# Patient Record
Sex: Female | Born: 1955 | Race: White | Hispanic: No | Smoking: Current every day smoker
Health system: Southern US, Community
[De-identification: ages and names within clinical notes are randomized; demographics above are authoritative.]

---

## 2001-08-02 HISTORY — PX: OTHER SURGICAL HISTORY: SHX169

## 2002-08-31 ENCOUNTER — Ambulatory Visit (HOSPITAL_COMMUNITY): Admission: RE | Admit: 2002-08-31 | Discharge: 2002-08-31 | Payer: Self-pay | Admitting: Pulmonary Disease

## 2002-11-02 ENCOUNTER — Ambulatory Visit (HOSPITAL_COMMUNITY): Admission: RE | Admit: 2002-11-02 | Discharge: 2002-11-02 | Payer: Self-pay | Admitting: Family Medicine

## 2003-05-02 ENCOUNTER — Ambulatory Visit (HOSPITAL_COMMUNITY): Admission: RE | Admit: 2003-05-02 | Discharge: 2003-05-02 | Payer: Self-pay | Admitting: Pulmonary Disease

## 2003-07-02 ENCOUNTER — Inpatient Hospital Stay (HOSPITAL_COMMUNITY): Admission: RE | Admit: 2003-07-02 | Discharge: 2003-07-03 | Payer: Self-pay | Admitting: Neurosurgery

## 2004-03-20 ENCOUNTER — Ambulatory Visit (HOSPITAL_COMMUNITY): Admission: RE | Admit: 2004-03-20 | Discharge: 2004-03-20 | Payer: Self-pay | Admitting: Neurosurgery

## 2004-09-01 ENCOUNTER — Ambulatory Visit: Admission: RE | Admit: 2004-09-01 | Discharge: 2004-09-01 | Payer: Self-pay | Admitting: Neurosurgery

## 2004-09-08 ENCOUNTER — Inpatient Hospital Stay (HOSPITAL_COMMUNITY): Admission: RE | Admit: 2004-09-08 | Discharge: 2004-09-09 | Payer: Self-pay | Admitting: Neurosurgery

## 2005-01-15 ENCOUNTER — Ambulatory Visit (HOSPITAL_COMMUNITY): Admission: RE | Admit: 2005-01-15 | Discharge: 2005-01-15 | Payer: Self-pay | Admitting: Family Medicine

## 2007-09-01 ENCOUNTER — Ambulatory Visit: Payer: Self-pay | Admitting: Gastroenterology

## 2007-09-01 ENCOUNTER — Ambulatory Visit (HOSPITAL_COMMUNITY): Admission: RE | Admit: 2007-09-01 | Discharge: 2007-09-01 | Payer: Self-pay | Admitting: Gastroenterology

## 2008-05-31 ENCOUNTER — Ambulatory Visit (HOSPITAL_COMMUNITY): Admission: RE | Admit: 2008-05-31 | Discharge: 2008-05-31 | Payer: Self-pay | Admitting: Pulmonary Disease

## 2008-06-07 ENCOUNTER — Ambulatory Visit (HOSPITAL_COMMUNITY): Admission: RE | Admit: 2008-06-07 | Discharge: 2008-06-07 | Payer: Self-pay | Admitting: Pulmonary Disease

## 2008-06-14 ENCOUNTER — Encounter (HOSPITAL_COMMUNITY): Admission: RE | Admit: 2008-06-14 | Discharge: 2008-07-14 | Payer: Self-pay | Admitting: Pulmonary Disease

## 2009-06-19 IMAGING — NM NM HEPATO W/GB/PHARM/[PERSON_NAME]
2 series · 12 of 12 positions shown · non-contrast
Comparison: None

Addendum Begins

No re-creation of patient's symptoms with half-and-half ingestion.
Addendum Ends
CLINICAL DATA: Abdominal pain
NUCLEAR MEDICINE HEPATOBILIARY IMAGING WITH GALLBLADDER EF:
TECHNIQUE: Sequential images of the abdomen were obtained for 60
minutes following intravenous administration of
radiopharmaceutical.  After standardized fatty meal challenge
utilizing 8 ounces of Half-and-Half, imaging was continued for an
additional 60 minutes.  Gallbladder ejection fraction was
calculated from quantitative analysis of the post fatty meal
images.
Radiopharmaceutical:  5 mCi 8c-WWm mebrofenin

[Series 1: hepatobiliary · 3.20mm/px · 6 of 60 frames shown (1 of 2)]
[frame 6/60]
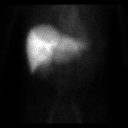
[frame 16/60]
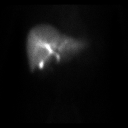
[frame 26/60]
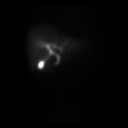
[frame 36/60]
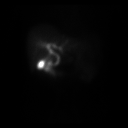
[frame 46/60]
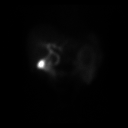
[frame 56/60]
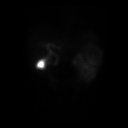

[Series 1: hepatobiliary · 3.20mm/px · 6 of 60 frames shown (2 of 2)]
[frame 6/60]
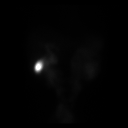
[frame 16/60]
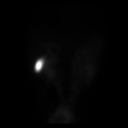
[frame 26/60]
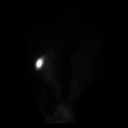
[frame 36/60]
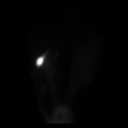
[frame 46/60]
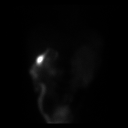
[frame 56/60]
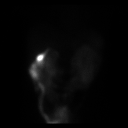

[12 of 12 positions shown; findings below may reference images not displayed]

FINDINGS: Prompt tracer extraction from bloodstream, indicating normal
hepatocellular function.
Prompt excretion of tracer into biliary tree.
Gallbladder visualized at 15 minutes.
Small bowel visualized at 30 minutes.
No hepatic retention of tracer.

Subjectively normal emptying of tracer occurs from gallbladder
following fatty meal stimulation.
Calculated gallbladder ejection fraction is 48%, minimally below
lower normal range of 50%.,
IMPRESSION: Patent biliary tree.
Borderline abnormal gallbladder response to fatty meal stimulation
with minimally decreased gallbladder ejection fraction of 48%.

## 2010-12-15 NOTE — Op Note (Signed)
[name], [name]              ACCOUNT NO.:  [id number]   MEDICAL RECORD NO.:  [id number]          PATIENT TYPE:  AMB   LOCATION:  DAY                           FACILITY:  [facility]   PHYSICIAN:  Misty Kerr, M.D.      DATE OF BIRTH:  09-Apr-1956   DATE OF PROCEDURE:  09/01/2007  DATE OF DISCHARGE:  09/01/2007                               OPERATIVE REPORT   PROCEDURE:  Colonoscopy.   INDICATION FOR EXAM:  Misty Kerr is a 55 year old female who presents  for average-risk colon cancer screening.   FINDINGS:  1. Normal colon without evidence of polyps, mass, erosion,      inflammatory changes, diverticula or AVMs.  Fair bowel prep.  2. Normal retroflex view of the rectum.   RECOMMENDATIONS:  1. Screening colonoscopy in 10 years with a 2-day bowel prep.  2. She should follow a high-fiber diet.  She is given a handout on      high-fiber diet.   MEDICATIONS:  1. Demerol 100 mg IV.  2. Versed 6 mg IV.   PROCEDURE TECHNIQUE:  Physical exam was performed.  Informed consent was  obtained from the patient explaining benefits, risks and alternatives to  the procedure.  The patient was connected to the monitor and placed in  the left lateral position.  Continuous oxygen was provided by nasal  cannula and IV medicine administered through an indwelling cannula.  After administration of sedation and rectal exam, the patient's rectum  was intubated and the scope was advanced under direct visualization to  the cecum.  The scope was removed slowly by carefully examining the  color, texture, anatomy and integrity of the mucosa on the way out.  The  patient was recovered in endoscopy and discharged home in satisfactory  condition.      Misty Kerr, M.D.  Electronically Signed    SM/MEDQ  D:  09/04/2007  T:  09/04/2007  Job:  [id number]   cc:   Ramon Dredge L. Juanetta Gosling, M.D.  Fax: [phone]

## 2010-12-18 NOTE — Op Note (Signed)
NAMEJOSSETTE, ZIRBEL                        ACCOUNT NO.:  [id number]   MEDICAL RECORD NO.:  [id number]                   PATIENT TYPE:  INP   LOCATION:  2899                                 FACILITY:  [facility]   PHYSICIAN:  Clydene Fake, M.D.               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   OPERATION PERFORMED:  Anterior cervical decompression and diskectomy with  fusion of C5-6 and C6-7 with Life-Net allograft bone and tether anterior  cervical plate.   SURGEON:  Clydene Fake, M.D.   ASSISTANT:  Hewitt Shorts, M.D.   ANESTHESIA:  General endotracheal tube.   ESTIMATED BLOOD LOSS:  None.   DRAINS:  None.   COMPLICATIONS:  None.   INDICATIONS FOR PROCEDURE:  The patient is a 55 year old woman who has had a  few month history of left arm and neck pain __________ radiating to her  fingers.  Found to have weakness in left triceps on extension, decreased  sensation in the left C6-7 roots. MRI was done showing spondylosis,  biforaminal narrowing at 5-6 and spondylosis and left-sided disk herniation  6-7.  Patient brought in for decompression fusion.   DESCRIPTION OF PROCEDURE:  The patient was brought to the operating room and  general anesthesia induced.  Patient was placed in five pounds halter  traction.  Prepped and draped in the usual sterile fashion.  Site of  incision was injected with 10 mL of 1% lidocaine with epinephrine.  Incision  was then made from the midline to the anterior border of the  sternocleidomastoid muscle on the left side of the neck.  Incision taken  down to the platysma and hemostasis obtained with Bovie cautery.   The  platysma was incised with the Bovie and blunt dissection was used to enter  the anterior cervical fascia at the anterior cervical spine.  A needle was  placed in the interspace.  X-ray was obtained showing this was at the 6-7  interspace.  The disk space was incised with a 15 blade as the needles were  removed and partial diskectomy performed.  Longus colli muscles were  reflected laterally on each side using the Bovie and then the self-retaining  retractor system was then placed.  The 5-6 and 6-7 disk spaces were then  incised with a 15 blade and diskectomy performed with pituitary rongeurs and  curets and then drilled down the disk space drilling off the cartilaginous  end plates of the uncovertebral joints at the 6-7 level.  Then finishing the  diskectomy by removing the posterior ligament of osteophytes with 1 and 2 mm  Kerrison punches  and performing foraminotomies with disk fragments on the  left side at the 6-7 level.  When we were finished, we had good central and  foraminal decompression.  We then measured the height of the disk space,  measured it to be 6 mm.  We roughed up the end plates with a 6 mm rasp.  Got  hemostasis with Gelfoam and thrombin.  Attention was then taken to 5-6 and  drilled the interspace with a high speed drill drilling the cartilaginous  end plates and uncovertebral joints bilaterally finishing the diskectomy by  removing the posterior osteophytes and posterior longitudinal ligament with  1 and 2 mm Kerrison punches.  Foraminotomies were then performed with the  punch. When we were finished, we had good decompression of the central canal  and both foramina.  The disk space was measured to be 5 mm.  Roughed up the  end plates with rasp from the Life-Net bone set.  Excellent hemostasis with  Gelfoam.  Gelfoam was then removed.  The wound was irrigated with antibiotic  solution.  A 6 mm bone graft was tapped into the C6-7 space and a 5 mm bone  graft was  tapped into the C5-6 space.  Countersunk about 1 mm.  We checked  posterior to the graft and there was plenty of room between bone graft and  dura.  Weights and retraction were removed.  Distraction pins which were put  in prior to the diskectomy at C5 and 7 were then removed and hemostasis was  obtained with Gelfoam and thrombin.  Anterior cervical plate was placed over  the anterior cervical spine with two screws placed in C5, one in C6 and two  in C7. These were tightened and an x-ray was obtained showing good position  of plate, screws and interbody bone plugs at C5-6, 6-7.  Retractors removed.  Hemostasis obtained with Gelfoam and thrombin and bipolar cautery.  Gelfoam  was irrigated out.  The platysma was then closed with 3-0 Vicryl interrupted  suture and the subcutaneous tissue was closed with the same and the skin  closed with benzoin and Steri-Strips.  Dressing was applied.  The patient  was placed in soft cervical collar, awakened from anesthesia and transferred  to recovery room in stable condition.                                               Clydene Fake, M.D.    JRH/MEDQ  D:  07/02/2003  T:  07/02/2003  Job:  [id number]

## 2010-12-18 NOTE — Op Note (Signed)
[name], Kerr              ACCOUNT NO.:  [id number]   MEDICAL RECORD NO.:  [id number]          PATIENT TYPE:  INP   LOCATION:  2899                         FACILITY:  [facility]   PHYSICIAN:  Clydene Fake, M.D.  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   PROCEDURE:  __________ fusion, redo anterior cervical decompression and  diskectomy at C6-7 with ___________ Allograft bone and Eagle anterior  cervical plate with removal of the previous tethered plate.   SURGEON:  Clydene Fake, M.D.   ASSISTANT:  Danae Orleans. Venetia Maxon, M.D.   ANESTHESIA:  General endotracheal tube anesthesia.   ESTIMATED BLOOD LOSS:  250 mL.   BLOOD GIVEN:  None.   DRAINS:  None.   COMPLICATIONS:  None.   INDICATIONS:  The patient is a 55 year old woman who underwent two level  diskectomy at C6-7.  In the past she has had neck pain and numbness with  pain radiating to the right arm.  CT showed nonunion at C6-7.  The patient  was going to have a posterior decompression and fusion but prior to surgery  started having more and more left-sided symptoms.  An MRI was done,  unchanged from prior MRI, but there is a biforaminal narrowing due to a  spinal process and it was felt the best way to approach this might be to do  a redo ACF and decompression of both foramen.  The patient was brought in  for ____________ redo anterior cervical fusion.   DESCRIPTION OF PROCEDURE:  The patient was brought to the operating room and  general anesthesia induced.  The patient was placed in 5 pounds of Halter  traction, prepped and draped in a sterile fashion.  The site of the incision  was injected with  8 mL of 1% lidocaine with epinephrine.  Incision was made  at the site of the previous scar on the left side of the neck of the midline  to the anterior border of the sternocleidomastoid muscle.  This was then  taken down to the platysma.  Hemostasis was obtained with Bovie  electrocoagulation.  ___________ was dissected with __________ down to the  anterior cervical fascia down to the anterior cervical spine.  The scar over  the anterior plate was removed.  We exposed the anterior cervical plate, the  screws in C5, C6 and C7.  We were able to remove the screws at C7 and C6  without problems.  Outside the C7 screw was actually loose.  We were unable  to remove the screws from C6.  They were tightly in place.  The locking  mechanism, however, was stuck in the plates and the hands were stripped.  A  metal ________ bur was then used to remove screw heads which we were then  able to reach the plate.  We  used a rongeur to ___________ pieces, and then  used the high speed drill to sand down the metal edges of the stumps of the  screws that were still in the C5 vertebral body where there appears to be a  good smooth surface.  We explored the C5-6 fusion there, no motion at the  site of the nonunion.  We explored C6-7, and there was a line between C6-7  showing nonunion ___________.  We used the high speed drill to drill down to  the disk space.  The microscope was brought in.  Microdiskectomy was  performed.  We continued to drill out the disk space and found a lot of soft  tissue in this disk space.  We dissected down to the dura.  We performed  decompression centrally and then did biforaminal decompression to the screw  and soft tissue down the foramen.  We distracted the disk space up and we  moved the roots had showing good decompression of the nerve root __________.  Hemostasis was obtained with Gelfoam and thrombin.  ___________ body  ___________ Allograft bone was then tapped into place.  We  checked the head  of the bone graft  __________between the bone graft and dura.  An Eagle  anterior cervical plate placed over the anterior cervical spine.  Two screws  were placed in the C6 and two in the C7.  These were taken down.  Lateral x-  rays were taken showing good position of bone graft and plates and screws of  the C6-7 level, and the screws and old plate left at C5.  The wound was  irrigated with antibiotic solution.  Good hemostasis was obtained by bipolar  cauterization.  Gelfoam was irrigated out.  The retractors were removed, and  the __________ with 3-0 Vicryl interrupted sutures and subcutaneous and skin  was closed with Benzoin and Steri-Strips.  The patient was placed in  ___________ and transferred to the recovery room in stable condition.      JRH/MEDQ  D:  09/08/2004  T:  09/08/2004  Job:  [id number]

## 2011-07-02 ENCOUNTER — Other Ambulatory Visit (HOSPITAL_COMMUNITY): Payer: Self-pay | Admitting: Pulmonary Disease

## 2011-07-02 DIAGNOSIS — Z139 Encounter for screening, unspecified: Secondary | ICD-10-CM

## 2011-07-09 ENCOUNTER — Ambulatory Visit (HOSPITAL_COMMUNITY)
Admission: RE | Admit: 2011-07-09 | Discharge: 2011-07-09 | Disposition: A | Discharge status: Home / Self Care | Payer: BC Managed Care – PPO | Source: Ambulatory Visit | Attending: Pulmonary Disease | Admitting: Pulmonary Disease

## 2011-07-09 DIAGNOSIS — Z139 Encounter for screening, unspecified: Secondary | ICD-10-CM

## 2011-07-09 DIAGNOSIS — Z1231 Encounter for screening mammogram for malignant neoplasm of breast: Secondary | ICD-10-CM | POA: Insufficient documentation

## 2017-08-03 ENCOUNTER — Encounter: Payer: Self-pay | Admitting: Gastroenterology

## 2019-11-19 ENCOUNTER — Other Ambulatory Visit: Payer: Self-pay

## 2019-11-19 ENCOUNTER — Ambulatory Visit (INDEPENDENT_AMBULATORY_CARE_PROVIDER_SITE_OTHER): Payer: Self-pay | Admitting: Dermatology

## 2019-11-19 DIAGNOSIS — L570 Actinic keratosis: Secondary | ICD-10-CM

## 2019-11-19 DIAGNOSIS — L578 Other skin changes due to chronic exposure to nonionizing radiation: Secondary | ICD-10-CM

## 2019-11-19 DIAGNOSIS — L720 Epidermal cyst: Secondary | ICD-10-CM

## 2019-11-19 NOTE — Progress Notes (Signed)
   New Patient Visit  Subjective  Misty Kerr is a 64 y.o. female who presents for the following: flaky spot (On tip of nose has been there for 6 months, does not cause pain or itch, does peel on occasion) and large spot (on left cheek has been there for 3 to 4 years, started as a black head, has not resolved, no pain or itch.).    Objective  Well appearing patient in no apparent distress; mood and affect are within normal limits.  A focused examination was performed including face, neck, chest and back and Face. Relevant physical exam findings are noted in the Assessment and Plan.  Objective  Distal dorsum nose: Erythematous thin papules/macules with gritty scale.   Objective  Left inferior cheek: 3cm firm subcutaneous nodule  Assessment & Plan  AK (actinic keratosis) Distal dorsum nose  Destruction of lesion - Distal dorsum nose Complexity: simple   Destruction method: cryotherapy   Informed consent: discussed and consent obtained   Timeout:  patient name, date of birth, surgical site, and procedure verified Lesion destroyed using liquid nitrogen: Yes   Region frozen until ice ball extended beyond lesion: Yes   Outcome: patient tolerated procedure well with no complications   Post-procedure details: wound care instructions given    Epidermal inclusion cyst Left inferior cheek  Discussed excision, patient will schedule. Surgical self pay cost: Code 57903  $310.20 Code 83338   $407.00    Total cost    $717.20   Actinic Damage - diffuse scaly erythematous macules with underlying dyspigmentation - Recommend daily broad spectrum sunscreen SPF 30+ to sun-exposed areas, reapply every 2 hours as needed.  - Call for new or changing lesions.  Return 3 to 4 months, for AK Follow up.   Allen Norris, CMA, am acting as scribe for Armida Sans, MD .  Documentation: I have reviewed the above documentation for accuracy and completeness, and I agree with the  above.  Armida Sans, MD

## 2019-11-20 ENCOUNTER — Encounter: Payer: Self-pay | Admitting: Dermatology

## 2020-01-22 ENCOUNTER — Other Ambulatory Visit: Payer: Self-pay

## 2020-01-22 ENCOUNTER — Encounter (INDEPENDENT_AMBULATORY_CARE_PROVIDER_SITE_OTHER): Payer: Self-pay

## 2020-01-22 ENCOUNTER — Ambulatory Visit (INDEPENDENT_AMBULATORY_CARE_PROVIDER_SITE_OTHER): Payer: Self-pay | Admitting: Dermatology

## 2020-01-22 ENCOUNTER — Telehealth: Payer: Self-pay

## 2020-01-22 ENCOUNTER — Encounter: Payer: Self-pay | Admitting: Dermatology

## 2020-01-22 DIAGNOSIS — L720 Epidermal cyst: Secondary | ICD-10-CM

## 2020-01-22 MED ORDER — MUPIROCIN 2 % EX OINT: 1.0000 "application " | TOPICAL_OINTMENT | Freq: Every day | CUTANEOUS | 0 refills | Status: DC

## 2020-01-22 NOTE — Telephone Encounter (Signed)
Lft pt msg to call if any problems after today's surgery./sh 

## 2020-01-22 NOTE — Progress Notes (Signed)
   Follow-Up Visit   Subjective  Misty Kerr is a 64 y.o. female who presents for the following: Cyst (L cheek >10 yrs, growing, pt presents for excision).  The following portions of the chart were reviewed this encounter and updated as appropriate:  Allergies  Meds  Problems  Med Hx  Surg Hx  Fam Hx     Review of Systems:  No other skin or systemic complaints except as noted in HPI or Assessment and Plan.  Objective  Well appearing patient in no apparent distress; mood and affect are within normal limits.  A focused examination was performed including face. Relevant physical exam findings are noted in the Assessment and Plan.  Objective  L cheek: Cystic pap 2.5cm  Images       Assessment & Plan    Epidermal cyst L cheek  Skin excision - L cheek  Lesion length (cm):  2.5 Lesion width (cm):  2.5 Margin per side (cm):  0.1 Total excision diameter (cm):  2.7 Informed consent: discussed and consent obtained   Timeout: patient name, date of birth, surgical site, and procedure verified   Procedure prep:  Patient was prepped and draped in usual sterile fashion Prep type:  Isopropyl alcohol and povidone-iodine Anesthesia: the lesion was anesthetized in a standard fashion   Anesthesia comment:  9cc Anesthetic:  1% lidocaine w/ epinephrine 1-100,000 buffered w/ 8.4% NaHCO3 Instrument used comment:  15c blade Hemostasis achieved with: pressure   Hemostasis achieved with comment:  Electrocautery Outcome: patient tolerated procedure well with no complications   Post-procedure details: sterile dressing applied and wound care instructions given   Dressing type: bandage and pressure dressing (Mupirocin)    Skin repair - L cheek Complexity:  Complex Final length (cm):  2.6 Reason for type of repair: reduce tension to allow closure, reduce the risk of dehiscence, infection, and necrosis, reduce subcutaneous dead space and avoid a hematoma, allow closure of the large  defect, preserve normal anatomy, preserve normal anatomical and functional relationships and enhance both functionality and cosmetic results   Undermining: area extensively undermined   Undermining comment:  Undermining Defect 2.7 cm Subcutaneous layers (deep stitches):  Suture size:  5-0 Suture type: Vicryl (polyglactin 910)   Subcutaneous suture technique: Inverted Dermal. Fine/surface layer approximation (top stitches):  Suture size:  5-0 Suture type: nylon   Stitches: simple running   Suture removal (days):  7 Hemostasis achieved with: suture and pressure Outcome: patient tolerated procedure well with no complications   Post-procedure details: sterile dressing applied and wound care instructions given   Dressing type: bandage and pressure dressing (Mupirocin)    mupirocin ointment (BACTROBAN) 2 % - L cheek  Specimen 1 - Surgical pathology Differential Diagnosis: D48.5 Cyst vs other Check Margins: No 2.5cm cystic pap  Return in about 1 week (around 01/29/2020) for sr.  I, Ardis Rowan, RMA, am acting as scribe for Armida Sans, MD .  Documentation: I have reviewed the above documentation for accuracy and completeness, and I agree with the above.  Armida Sans, MD

## 2020-01-22 NOTE — Patient Instructions (Signed)

## 2020-01-24 ENCOUNTER — Encounter: Payer: Self-pay | Admitting: Dermatology

## 2020-01-29 ENCOUNTER — Other Ambulatory Visit: Payer: Self-pay

## 2020-01-29 ENCOUNTER — Ambulatory Visit (INDEPENDENT_AMBULATORY_CARE_PROVIDER_SITE_OTHER): Payer: Self-pay | Admitting: Dermatology

## 2020-01-29 DIAGNOSIS — Z872 Personal history of diseases of the skin and subcutaneous tissue: Secondary | ICD-10-CM

## 2020-01-29 DIAGNOSIS — L720 Epidermal cyst: Secondary | ICD-10-CM

## 2020-01-29 DIAGNOSIS — Z4802 Encounter for removal of sutures: Secondary | ICD-10-CM

## 2020-01-29 NOTE — Progress Notes (Signed)
   Follow-Up Visit   Subjective  Misty Kerr is a 64 y.o. female who presents for the following: Suture / Staple Removal (1 week f/u biopsy proven Epidermal cyst L cheek, pt c/o slight around the lips) and Actinic Keratosis (f/u Ak on distal dorsum nose treated 2 months ago, no sign of recurrence ).  The following portions of the chart were reviewed this encounter and updated as appropriate:  Allergies  Meds  Problems  Med Hx  Surg Hx  Fam Hx      Review of Systems:  No other skin or systemic complaints except as noted in HPI or Assessment and Plan.  Objective  Well appearing patient in no apparent distress; mood and affect are within normal limits.  A focused examination was performed including face. Relevant physical exam findings are noted in the Assessment and Plan.  Objective  L cheek: Well healed scar with no evidence of recurrence.   Objective  distal dorsum nose: Well healed scar with no evidence of recurrence.    Assessment & Plan    Epidermal cyst L cheek  Biopsy proven Cyst, No abnormality of movement, numbness around mouth will improve overtime.  Encounter for Removal of Sutures - Incision site at the L cheek is clean, dry and intact - Wound cleansed, sutures removed, wound cleansed and steri strips applied.  - Discussed pathology results showing Epidermal cyst  - Patient advised to keep steri-strips dry until they fall off. - Scars remodel for a full year. - Once steri-strips fall off, patient can apply over-the-counter silicone scar cream each night to help with scar remodeling if desired. - Patient advised to call with any concerns or if they notice any new or changing lesions.   Hx of actinic keratosis distal dorsum nose  Observe   Recommend daily broad spectrum sunscreen SPF 30+ to sun-exposed areas, reapply every 2 hours as needed. Call for new or changing lesions.   Return in about 10 months (around 11/28/2020) for Aks .   IAngelique Holm, CMA, am acting as scribe for Armida Sans, MD .  Documentation: I have reviewed the above documentation for accuracy and completeness, and I agree with the above.  Armida Sans, MD

## 2020-02-11 ENCOUNTER — Encounter: Payer: Self-pay | Admitting: Dermatology

## 2020-02-14 ENCOUNTER — Ambulatory Visit: Payer: Self-pay | Admitting: Dermatology

## 2021-12-04 ENCOUNTER — Encounter: Payer: Self-pay | Admitting: Nurse Practitioner

## 2021-12-04 ENCOUNTER — Ambulatory Visit (INDEPENDENT_AMBULATORY_CARE_PROVIDER_SITE_OTHER): Payer: HMO | Admitting: Nurse Practitioner

## 2021-12-04 VITALS — BP 128/76 | HR 75 | Ht 66.0 in | Wt 124.1 lb

## 2021-12-04 DIAGNOSIS — Z1231 Encounter for screening mammogram for malignant neoplasm of breast: Secondary | ICD-10-CM | POA: Diagnosis not present

## 2021-12-04 DIAGNOSIS — Z139 Encounter for screening, unspecified: Secondary | ICD-10-CM

## 2021-12-04 DIAGNOSIS — F17200 Nicotine dependence, unspecified, uncomplicated: Secondary | ICD-10-CM

## 2021-12-04 DIAGNOSIS — Z1211 Encounter for screening for malignant neoplasm of colon: Secondary | ICD-10-CM

## 2021-12-04 NOTE — Progress Notes (Deleted)
? ?  Complete physical exam ? ?Patient: Misty Kerr   DOB: 01-08-1956   66 y.o. Female  MRN: [id number] ? ?Subjective:  ?  ?Chief Complaint  ?Patient presents with  ? New Patient (Initial Visit)  ?  Establish care   ? ? ?Misty Kerr is a 66 y.o. female who presents today for a complete physical exampresents to establish care, has not been to a doctor in 10 years, previous Dr Juanetta Gosling.  ?. She reports consuming a general diet. She generally feels well. She reports sleeping well. She does have additional problems to discuss today.  ? ? ?She used to wear contact glasses but has not had an eye exam in years, gets regular dental exam twice yearly.  ? ? ? ?Most recent fall risk assessment: ? ?  12/04/2021  ? 10:14 AM  ?Fall Risk   ?Falls in the past year? 1  ?Number falls in past yr: 0  ?Injury with Fall? 1  ?Risk for fall due to : History of fall(s)  ?Follow up Falls evaluation completed  ? ?  ?Most recent depression screenings: ? ?  12/04/2021  ? 10:14 AM  ?PHQ 2/9 Scores  ?PHQ - 2 Score 0  ? ? ?{VISON DENTAL STD PSA (Optional):27386} ? ?{History (Optional):23778} ? ?Patient Care Team: ?Donell Beers, FNP as PCP - General (Nurse Practitioner) ?Patient, No Pcp Per (Inactive) (General Practice)  ? ?No outpatient medications prior to visit.  ? ?No facility-administered medications prior to visit.  ? ? ?ROS ? ? ? ? ?   ?Objective:  ? ?  ?BP 128/76   Pulse 75   Ht 5\' 6"  (1.676 m)   Wt 124 lb 1.3 oz (56.3 kg)   SpO2 97%   BMI 20.03 kg/m?  ?{Vitals History (Optional):23777} ? ?Physical Exam  ? ?No results found for any visits on 12/04/21. ?{Show previous labs (optional):23779} ?   ?Assessment & Plan:  ?  ?Routine Health Maintenance and Physical Exam ? ?Immunization History  ?Administered Date(s) Administered  ? Moderna SARS-COV2 Booster Vaccination 09/18/2019  ? Moderna Sars-Covid-2 Vaccination 08/21/2019, 09/19/2019  ? ? ?Health Maintenance  ?Topic Date Due  ? Pneumonia Vaccine 33+ Years old (1 - PCV) Never  done  ? HIV Screening  Never done  ? Hepatitis C Screening  Never done  ? TETANUS/TDAP  Never done  ? Zoster Vaccines- Shingrix (1 of 2) Never done  ? PAP SMEAR-Modifier  Never done  ? COLONOSCOPY (Pts 45-36yrs Insurance coverage will need to be confirmed)  Never done  ? MAMMOGRAM  07/08/2013  ? COVID-19 Vaccine (3 - Mixed Product risk series) 10/17/2019  ? DEXA SCAN  Never done  ? INFLUENZA VACCINE  03/02/2022  ? HPV VACCINES  Aged Out  ? ? ?Discussed health benefits of physical activity, and encouraged her to engage in regular exercise appropriate for her age and condition. ? ?Problem List Items Addressed This Visit   ?None ? ?No follow-ups on file. ? ?  ? ?05/02/2022, FNP ? ? ?

## 2021-12-04 NOTE — Assessment & Plan Note (Signed)
?  Current everyday smoker. Started smoking at age 66, smokes half pack of cigarettes daily. She has quit several times, need to quit smoking including risk of lung cancer, COPD, stroke, MI and other respiratory diseases discussed with patient.  Patient stated that she will continue to work on cutting back cigarette smoking. patient declined referral for low-dose CT scan screening for lung cancer today.  ?Follow-up in 4 weeks. ?

## 2021-12-04 NOTE — Patient Instructions (Signed)
Please get your TDAP vaccine, pneumonia vaccine and shingles vaccine at your pharmacy  ? ? ?Pleas get your fasting labs done as discussed 3-5 days before your next appointment.  ? ? ?It is important that you exercise regularly at least 30 minutes 5 times a week.  ?Think about what you will eat, plan ahead. ?Choose " clean, green, fresh or frozen" over canned, processed or packaged foods which are more sugary, salty and fatty. ?70 to 75% of food eaten should be vegetables and fruit. ?Three meals at set times with snacks allowed between meals, but they must be fruit or vegetables. ?Aim to eat over a 12 hour period , example 7 am to 7 pm, and STOP after  your last meal of the day. ?Drink water,generally about 64 ounces per day, no other drink is as healthy. Fruit juice is best enjoyed in a healthy way, by EATING the fruit. ? ?Thanks for choosing [facility], we consider it a privelige to serve you.  ?

## 2021-12-04 NOTE — Progress Notes (Signed)
? ?New Patient Office Visit ? ?Subjective   ? ?Patient ID: Misty Kerr, female    DOB: 06/20/1956  Age: 66 y.o. MRN: [id number] ? ?CC:  ?Chief Complaint  ?Patient presents with  ? New Patient (Initial Visit)  ?  Establish care   ? ? ?HPI ?Misty Kerr presents to establish care, she  has not been to a doctor in 10 years, previous Dr Luan Pulling. States that she has had a neck surgery in the past , current every day smoker.  Not currently on any medication. ? ?Preventative care . Due for PAP, Colon cancer screening , mammogram, DEXA scan , PNEUMONIA vaccines, Tdap vaccine, shingles vaccine. Need for all vaccines discussed with pt , pt encouraged to get vaccines at her pharmacy , states that she will think about it , pt refused DEXA scan , Cologuard and mammogram ordered today.  ? ?Current everyday smoker. Started smoking at age 57, smokes half pack of cigarettes daily. She has quit several times, need to quit smoking including risk of lung cancer, COPD, stroke, MI and other respiratory diseases discussed with patient.  Patient stated that she will continue to work on cutting back cigarette smoking. patient declined referral for low-dose CT scan screening for lung cancer today.  ? ?Records requested from previous PCP. ? ?No outpatient encounter medications on file as of 12/04/2021.  ? ?No facility-administered encounter medications on file as of 12/04/2021.  ? ? ?History reviewed. No pertinent past medical history. ? ?History reviewed. No pertinent surgical history. ? ?Family History  ?Problem Relation Age of Onset  ? Cancer - Lung Mother   ? Heart disease Father   ? Diabetes type II Father   ? Alzheimer's disease Sister   ? Hepatitis C Son   ? Stroke Maternal Grandmother   ? Stroke Maternal Grandfather   ? Heart attack Paternal Grandmother   ? Breast cancer Paternal Grandmother   ? Liver cancer Paternal Grandfather   ? Cancer - Colon Neg Hx   ? ? ?Social History  ? ?Socioeconomic History  ? Marital status: Married   ?  Spouse name: Not on file  ? Number of children: 1  ? Years of education: Not on file  ? Highest education level: Not on file  ?Occupational History  ? Not on file  ?Tobacco Use  ? Smoking status: Every Day  ?  Packs/day: 0.50  ?  Types: Cigarettes  ?  Passive exposure: Never  ? Smokeless tobacco: Never  ? Tobacco comments:  ?  Started smoking at age 33, smokes half pack of cigarettes daily. She has quit several times.   ?Substance and Sexual Activity  ? Alcohol use: Yes  ?  Alcohol/week: 2.0 standard drinks  ?  Types: 2 Shots of liquor per week  ?  Comment: 2 Bottles of tekila weekly .  ? Drug use: Yes  ?  Types: Marijuana  ? Sexual activity: Yes  ?  Partners: Male  ?Other Topics Concern  ? Not on file  ?Social History Narrative  ?  Lives with her son, Works at a [employer]   ? ?Social Determinants of Health  ? ?Financial Resource Strain: Not on file  ?Food Insecurity: Not on file  ?Transportation Needs: Not on file  ?Physical Activity: Not on file  ?Stress: Not on file  ?Social Connections: Not on file  ?Intimate Partner Violence: Not on file  ? ? ?Review of Systems  ?Constitutional: Negative.   ?Respiratory: Negative.    ?  Cardiovascular: Negative.   ?Neurological: Negative.   ?Psychiatric/Behavioral: Negative.    ? ?  ? ? ?Objective   ? ?BP 128/76   Pulse 75   Ht $R'5\' 6"'aE$  (1.676 m)   Wt 124 lb 1.3 oz (56.3 kg)   SpO2 97%   BMI 20.03 kg/m?  ? ?Physical Exam ?Constitutional:   ?   General: She is not in acute distress. ?   Appearance: Normal appearance. She is normal weight. She is not ill-appearing, toxic-appearing or diaphoretic.  ?Cardiovascular:  ?   Rate and Rhythm: Normal rate and regular rhythm.  ?   Pulses: Normal pulses.  ?   Heart sounds: Normal heart sounds. No murmur heard. ?  No friction rub. No gallop.  ?Pulmonary:  ?   Effort: Pulmonary effort is normal. No respiratory distress.  ?   Breath sounds: Normal breath sounds. No stridor. No wheezing, rhonchi or rales.  ?Chest:  ?   Chest wall: No  tenderness.  ?Skin: ?   Capillary Refill: Capillary refill takes less than 2 seconds.  ?Neurological:  ?   Mental Status: She is alert and oriented to person, place, and time.  ?Psychiatric:     ?   Mood and Affect: Mood normal.     ?   Behavior: Behavior normal.     ?   Thought Content: Thought content normal.     ?   Judgment: Judgment normal.  ? ? ? ?  ? ?Assessment & Plan:  ? ?Problem List Items Addressed This Visit   ?None ?Visit Diagnoses   ? ? Post-menopausal    -  Primary  ? Encounter for screening mammogram for malignant neoplasm of breast      ? Relevant Orders  ? MM 3D SCREEN BREAST BILATERAL  ? Screening for colon cancer      ? Relevant Orders  ? Cologuard  ? Screening due      ? Relevant Orders  ? CBC with Differential  ? CMP14+EGFR  ? Lipid Profile  ? HgB A1c  ? TSH + free T4  ? Vitamin D (25 hydroxy)  ? HIV antibody (with reflex)  ? Hepatitis C Antibody  ? ?  ? ? ?Return in about 1 month (around 01/04/2022) for annual physical exam and PAP.  ? ?Renee Rival, FNP ? ? ?

## 2022-01-04 LAB — COLOGUARD: COLOGUARD: NEGATIVE

## 2022-01-05 ENCOUNTER — Telehealth: Payer: Self-pay | Admitting: Nurse Practitioner

## 2022-01-05 NOTE — Telephone Encounter (Signed)
Pt returning your call

## 2022-01-05 NOTE — Progress Notes (Signed)
Cologuard test was normal

## 2022-01-06 NOTE — Telephone Encounter (Signed)
Spoke with pt yesterday.

## 2022-01-20 ENCOUNTER — Emergency Department (HOSPITAL_COMMUNITY): Payer: PPO

## 2022-01-20 ENCOUNTER — Other Ambulatory Visit: Payer: Self-pay

## 2022-01-20 ENCOUNTER — Encounter (HOSPITAL_COMMUNITY): Payer: Self-pay

## 2022-01-20 ENCOUNTER — Emergency Department (HOSPITAL_COMMUNITY)
Admission: EM | Admit: 2022-01-20 | Discharge: 2022-01-20 | Disposition: A | Discharge status: Home / Self Care | Payer: PPO | Attending: Emergency Medicine | Admitting: Emergency Medicine

## 2022-01-20 DIAGNOSIS — W109XXA Fall (on) (from) unspecified stairs and steps, initial encounter: Secondary | ICD-10-CM | POA: Diagnosis not present

## 2022-01-20 DIAGNOSIS — S63259A Unspecified dislocation of unspecified finger, initial encounter: Secondary | ICD-10-CM

## 2022-01-20 DIAGNOSIS — Z23 Encounter for immunization: Secondary | ICD-10-CM | POA: Insufficient documentation

## 2022-01-20 DIAGNOSIS — S63287A Dislocation of proximal interphalangeal joint of left little finger, initial encounter: Secondary | ICD-10-CM | POA: Diagnosis not present

## 2022-01-20 DIAGNOSIS — S6992XA Unspecified injury of left wrist, hand and finger(s), initial encounter: Secondary | ICD-10-CM | POA: Diagnosis present

## 2022-01-20 MED ORDER — TETANUS-DIPHTH-ACELL PERTUSSIS 5-2.5-18.5 LF-MCG/0.5 IM SUSY
0.5000 mL | PREFILLED_SYRINGE | Freq: Once | INTRAMUSCULAR | Status: AC
  Administered 2022-01-20: 0.5 mL via INTRAMUSCULAR
  Filled 2022-01-20: qty 0.5

## 2022-01-20 MED ORDER — BACITRACIN ZINC 500 UNIT/GM EX OINT
TOPICAL_OINTMENT | Freq: Two times a day (BID) | CUTANEOUS | Status: DC
  Filled 2022-01-20: qty 0.9

## 2022-01-20 MED ORDER — LIDOCAINE HCL (PF) 1 % IJ SOLN
5.0000 mL | Freq: Once | INTRAMUSCULAR | Status: DC
  Filled 2022-01-20: qty 5

## 2022-01-20 MED ORDER — NAPROXEN 500 MG PO TABS: 500.0000 mg | ORAL_TABLET | Freq: Two times a day (BID) | ORAL | 0 refills | Status: DC

## 2022-01-20 MED ORDER — NAPROXEN 250 MG PO TABS
500.0000 mg | ORAL_TABLET | Freq: Once | ORAL | Status: AC
Start: 2022-01-20 — End: 2022-01-20
  Administered 2022-01-20: 500 mg via ORAL
  Filled 2022-01-20: qty 2

## 2022-01-20 NOTE — ED Triage Notes (Signed)
Pt tripped and fell and injured her finger . Left hand pinky deformity noted.

## 2022-01-20 NOTE — ED Provider Notes (Signed)
[facility] EMERGENCY DEPARTMENT Provider Note   CSN: [id number] Arrival date & time: 01/20/22  0744     History  Chief Complaint  Patient presents with   Finger Injury    Misty Kerr is a 66 y.o. female.  HPI  This patient is a 66 year old female, denies any chronic medical problems and takes no daily medicines.  She reports that just prior to arrival while she was walking up the stairs she tripped and fell landing awkwardly and catching herself with her left hand, this caused an acute deformity of her left small finger with an associated bleeding wound to the distal finger just proximal to the nailbed.  This occurred approximately 1 hour ago  Home Medications Prior to Admission medications   Medication Sig Start Date End Date Taking? Authorizing Provider  naproxen (NAPROSYN) 500 MG tablet Take 1 tablet (500 mg total) by mouth 2 (two) times daily with a meal. 01/20/22  Yes Eber Hong, MD      Allergies    Penicillins    Review of Systems   Review of Systems  Musculoskeletal:  Positive for joint swelling.  Skin:  Positive for wound.    Physical Exam Updated Vital Signs BP 139/82   Pulse 86   Temp 98.1 F (36.7 C) (Oral)   Resp 16   Ht 1.676 m (5\' 6" )   Wt 54.4 kg   SpO2 100%   BMI 19.37 kg/m  Physical Exam Vitals and nursing note reviewed.  Constitutional:      Appearance: She is well-developed. She is not diaphoretic.  HENT:     Head: Normocephalic and atraumatic.  Eyes:     General:        Right eye: No discharge.        Left eye: No discharge.     Conjunctiva/sclera: Conjunctivae normal.  Pulmonary:     Effort: Pulmonary effort is normal. No respiratory distress.  Musculoskeletal:        General: Tenderness and deformity present.     Comments: The left small finger is deformed at the proximal interphalangeal joint with the finger deviated in an ulnar direction at that joint.  This is a closed wound however there is a small laceration proximal  to the nail fold  Skin:    General: Skin is warm and dry.     Findings: No erythema or rash.     Comments: Small skin defect proximal to the nail fold, this is a missing piece of tissue minimally bleeding, there is no laceration, no damage to the nailbed  Neurological:     Mental Status: She is alert.     Coordination: Coordination normal.     ED Results / Procedures / Treatments   Labs (all labs ordered are listed, but only abnormal results are displayed) Labs Reviewed - No data to display  EKG None  Radiology No results found.  Procedures Reduction of dislocation  Date/Time: 01/20/2022 8:35 AM  Performed by: 01/22/2022, MD Authorized by: Eber Hong, MD  Consent: Verbal consent obtained. Risks and benefits: risks, benefits and alternatives were discussed Consent given by: patient Patient understanding: patient states understanding of the procedure being performed Patient consent: the patient's understanding of the procedure matches consent given Test results: test results available and properly labeled Site marked: the operative site was marked Imaging studies: imaging studies available Required items: required blood products, implants, devices, and special equipment available Patient identity confirmed: verbally with patient Time out: Immediately prior to  procedure a "time out" was called to verify the correct patient, procedure, equipment, support staff and site/side marked as required. Preparation: Patient was prepped and draped in the usual sterile fashion. Local anesthesia used: no  Anesthesia: Local anesthesia used: no  Sedation: Patient sedated: no  Patient tolerance: patient tolerated the procedure well with no immediate complications Comments: With traction and manipulation the joint was successfully reduced of the left small finger at the proximal interphalangeal joint, normal alignment post procedure        Medications Ordered in  ED Medications  lidocaine (PF) (XYLOCAINE) 1 % injection 5 mL (has no administration in time range)  bacitracin ointment (has no administration in time range)  Tdap (BOOSTRIX) injection 0.5 mL (0.5 mLs Intramuscular Given 01/20/22 0828)  naproxen (NAPROSYN) tablet 500 mg (500 mg Oral Given 01/20/22 8546)    ED Course/ Medical Decision Making/ A&P                           Medical Decision Making Amount and/or Complexity of Data Reviewed Radiology: ordered.  Risk OTC drugs. Prescription drug management.   Appears to have a dislocation of the proximal interphalangeal joint of the left small finger, x-ray to rule out fracture  Imaging: I personally viewed and interpreted the x-ray of the small finger which shows a dislocation of the proximal interphalangeal joint of the left small finger and I agree with radiologist interpretation.  Procedure: Reduction of dislocation was performed, see separate note.  Imaging: I personally viewed and interpreted the follow-up x-ray of the small finger which shows an adequate reduction of the dislocation and normal alignment of the joint.  Immobilization: The patient was placed in a finger splint immobilizer by nursing, I reexamined the patient after this was placed and she has good capillary refill and sensation.  Wound care: The patient had topical antibiotics and sterile dressing applied to her finger and tetanus was updated.  Medication management: The patient will be discharged with an anti-inflammatory and follow-up with orthopedics.        Final Clinical Impression(s) / ED Diagnoses Final diagnoses:  Dislocation of finger, initial encounter    Rx / DC Orders ED Discharge Orders          Ordered    naproxen (NAPROSYN) 500 MG tablet  2 times daily with meals        01/20/22 0846              Eber Hong, MD 01/20/22 1100

## 2022-01-20 NOTE — Discharge Instructions (Signed)
The x-ray of your finger shows that everything is back to normal, there is no broken bones in the joint has been successfully reduced.  I would recommend that she wear this finger immobilizer for the next week, please have your orthopedic doctor help you with further physical therapy as needed to get this back to normal.  There will be some pain and swelling and stiffness of this joint so please take the following medication  Please take Naprosyn, 500mg  by mouth twice daily as needed for pain - this in an antiinflammatory medicine (NSAID) and is similar to ibuprofen - many people feel that it is stronger than ibuprofen and it is easier to take since it is a smaller pill.  Please use this only for 1 week - if your pain persists, you will need to follow up with your doctor in the office for ongoing guidance and pain control.

## 2022-01-29 ENCOUNTER — Inpatient Hospital Stay: Admission: RE | Admit: 2022-01-29 | Payer: HMO | Source: Ambulatory Visit

## 2022-02-26 ENCOUNTER — Ambulatory Visit
Admission: RE | Admit: 2022-02-26 | Discharge: 2022-02-26 | Disposition: A | Discharge status: Home / Self Care | Payer: PPO | Source: Ambulatory Visit | Attending: Nurse Practitioner | Admitting: Nurse Practitioner

## 2022-02-26 ENCOUNTER — Inpatient Hospital Stay: Admission: RE | Admit: 2022-02-26 | Payer: HMO | Source: Ambulatory Visit

## 2022-03-01 NOTE — Progress Notes (Signed)
Normal test repeat in one year

## 2022-04-09 ENCOUNTER — Ambulatory Visit (INDEPENDENT_AMBULATORY_CARE_PROVIDER_SITE_OTHER): Payer: PPO | Admitting: Nurse Practitioner

## 2022-04-09 ENCOUNTER — Encounter: Payer: Self-pay | Admitting: Nurse Practitioner

## 2022-04-09 VITALS — BP 138/82 | HR 80 | Ht 66.0 in | Wt 124.0 lb

## 2022-04-09 DIAGNOSIS — M79646 Pain in unspecified finger(s): Secondary | ICD-10-CM | POA: Insufficient documentation

## 2022-04-09 DIAGNOSIS — Z0001 Encounter for general adult medical examination with abnormal findings: Secondary | ICD-10-CM

## 2022-04-09 DIAGNOSIS — Z0000 Encounter for general adult medical examination without abnormal findings: Secondary | ICD-10-CM

## 2022-04-09 DIAGNOSIS — M79645 Pain in left finger(s): Secondary | ICD-10-CM

## 2022-04-09 NOTE — Patient Instructions (Addendum)
Please consider getting your FLU vaccine, pneumonia vaccine , shingles vaccine   Please take tylenol 650mg  every 6 hours as needed for your finger pain    It is important that you exercise regularly at least 30 minutes 5 times a week.  Think about what you will eat, plan ahead. Choose " clean, green, fresh or frozen" over canned, processed or packaged foods which are more sugary, salty and fatty. 70 to 75% of food eaten should be vegetables and fruit. Three meals at set times with snacks allowed between meals, but they must be fruit or vegetables. Aim to eat over a 12 hour period , example 7 am to 7 pm, and STOP after  your last meal of the day. Drink water,generally about 64 ounces per day, no other drink is as healthy. Fruit juice is best enjoyed in a healthy way, by EATING the fruit.  Thanks for choosing [facility], we consider it a privelige to serve you.

## 2022-04-09 NOTE — Progress Notes (Signed)
Subjective:    Misty Kerr is a 66 y.o. female who presents for a Welcome to Medicare exam.   Review of Systems Larey Seat while going upstairs at home in June, left pinky was injured, pain is better now.    Still thinking about getting the vaccines, need for all vaccines dicussed, pt encouraged to get vaccines due.  Advanced directives forms given today, patient encouraged to get forms completed and return to the office  EKG obtained shows SR with a heart rate of 73      Objective:    There were no vitals filed for this visit.There is no height or weight on file to calculate BMI.  Medications Outpatient Encounter Medications as of 04/09/2022  Medication Sig   naproxen (NAPROSYN) 500 MG tablet Take 1 tablet (500 mg total) by mouth 2 (two) times daily with a meal.   No facility-administered encounter medications on file as of 04/09/2022.     History: No past medical history on file. Past Surgical History:  Procedure Laterality Date   fusion in neck  2003   and again in 2005    Family History  Problem Relation Age of Onset   Cancer - Lung Mother    Heart disease Father    Diabetes type II Father    Alzheimer's disease Sister    Hepatitis C Son    Stroke Maternal Grandmother    Stroke Maternal Grandfather    Heart attack Paternal Grandmother    Breast cancer Paternal Grandmother    Liver cancer Paternal Grandfather    Cancer - Colon Neg Hx    Social History   Occupational History   Not on file  Tobacco Use   Smoking status: Every Day    Packs/day: 0.50    Types: Cigarettes    Passive exposure: Never   Smokeless tobacco: Never   Tobacco comments:    Started smoking at age 40, smokes half pack of cigarettes daily. She has quit several times.   Substance and Sexual Activity   Alcohol use: Yes    Alcohol/week: 2.0 standard drinks of alcohol    Types: 2 Shots of liquor per week    Comment: 2 Bottles of tekila weekly .   Drug use: Yes    Types: Marijuana    Sexual activity: Yes    Partners: Male    Tobacco Counseling Ready to quit: Not Answered Counseling given: Not Answered Tobacco comments: Started smoking at age 60, smokes half pack of cigarettes daily. She has quit several times.    Immunizations and Health Maintenance Immunization History  Administered Date(s) Administered   Moderna SARS-COV2 Booster Vaccination 09/18/2019   Moderna Sars-Covid-2 Vaccination 08/21/2019, 09/19/2019   Tdap 01/20/2022   Health Maintenance Due  Topic Date Due   Pneumonia Vaccine 27+ Years old (1 - PCV) Never done   HIV Screening  Never done   Hepatitis C Screening  Never done   Zoster Vaccines- Shingrix (1 of 2) Never done   PAP SMEAR-Modifier  Never done   COLONOSCOPY (Pts 45-72yrs Insurance coverage will need to be confirmed)  Never done   COVID-19 Vaccine (3 - Moderna risk series) 10/17/2019   DEXA SCAN  Never done   INFLUENZA VACCINE  Never done    Activities of Daily Living     No data to display          Physical Exam  optional), or other factors deemed appropriate based on the beneficiary's medical and social history and  current clinical standards.  Advanced Directives:      Assessment:    This is a routine wellness examination for this patient .   Vision/Hearing screen No results found.  Dietary issues and exercise activities discussed:      Goals   None    Depression Screen    12/04/2021   10:14 AM  PHQ 2/9 Scores  PHQ - 2 Score 0     Fall Risk    12/04/2021   10:14 AM  Fall Risk   Falls in the past year? 1  Number falls in past yr: 0  Injury with Fall? 1  Risk for fall due to : History of fall(s)  Follow up Falls evaluation completed    Cognitive Function:        Patient Care Team: Donell Beers, FNP as PCP - General (Nurse Practitioner) Patient, No Pcp Per (General Practice)     Plan:     I have personally reviewed and noted the following in the patient's chart:   Medical and social  history Use of alcohol, tobacco or illicit drugs  Current medications and supplements Functional ability and status Nutritional status Physical activity Advanced directives List of other physicians Hospitalizations, surgeries, and ER visits in previous 12 months Vitals Screenings to include cognitive, depression, and falls Referrals and appointments  In addition, I have reviewed and discussed with patient certain preventive protocols, quality metrics, and best practice recommendations. A written personalized care plan for preventive services as well as general preventive health recommendations were provided to patient.     Harriet Pho, CMA 04/09/2022  I have reviewed and agree with the above AWV documentation. Folashade R. Geoffery Spruce, FNP [facility] Location Provider: office Location Patient: office

## 2022-04-09 NOTE — Assessment & Plan Note (Signed)
Take tylenol 650mg  every 6 hours as needed.  Able to do ROM of the pinky finger, no swelling or redness noted.

## 2022-04-30 ENCOUNTER — Encounter: Payer: HMO | Admitting: Family Medicine

## 2022-04-30 ENCOUNTER — Encounter: Payer: HMO | Admitting: Nurse Practitioner

## 2022-05-08 LAB — CMP14+EGFR
ALT: 15 IU/L (ref 0–32)
AST: 18 IU/L (ref 0–40)
Albumin/Globulin Ratio: 2 (ref 1.2–2.2)
Albumin: 4.1 g/dL (ref 3.9–4.9)
Alkaline Phosphatase: 92 IU/L (ref 44–121)
BUN/Creatinine Ratio: 17 (ref 12–28)
BUN: 18 mg/dL (ref 8–27)
Bilirubin Total: 0.5 mg/dL (ref 0.0–1.2)
CO2: 26 mmol/L (ref 20–29)
Calcium: 9.5 mg/dL (ref 8.7–10.3)
Chloride: 103 mmol/L (ref 96–106)
Creatinine, Ser: 1.04 mg/dL — ABNORMAL HIGH (ref 0.57–1.00)
Globulin, Total: 2.1 g/dL (ref 1.5–4.5)
Glucose: 84 mg/dL (ref 70–99)
Potassium: 5.3 mmol/L — ABNORMAL HIGH (ref 3.5–5.2)
Sodium: 142 mmol/L (ref 134–144)
Total Protein: 6.2 g/dL (ref 6.0–8.5)
eGFR: 60 mL/min/{1.73_m2} (ref >59)

## 2022-05-08 LAB — CBC WITH DIFFERENTIAL/PLATELET
Basophils Absolute: 0.1 10*3/uL (ref 0.0–0.2)
Basos: 1 %
EOS (ABSOLUTE): 0.1 10*3/uL (ref 0.0–0.4)
Eos: 2 %
Hematocrit: 43.7 % (ref 34.0–46.6)
Hemoglobin: 14.7 g/dL (ref 11.1–15.9)
Immature Grans (Abs): 0 10*3/uL (ref 0.0–0.1)
Immature Granulocytes: 1 %
Lymphocytes Absolute: 2.9 10*3/uL (ref 0.7–3.1)
Lymphs: 38 %
MCH: 29.1 pg (ref 26.6–33.0)
MCHC: 33.6 g/dL (ref 31.5–35.7)
MCV: 86 fL (ref 79–97)
Monocytes Absolute: 0.4 10*3/uL (ref 0.1–0.9)
Monocytes: 6 %
Neutrophils Absolute: 4.1 10*3/uL (ref 1.4–7.0)
Neutrophils: 52 %
Platelets: 256 10*3/uL (ref 150–450)
RBC: 5.06 x10E6/uL (ref 3.77–5.28)
RDW: 12.6 % (ref 11.7–15.4)
WBC: 7.7 10*3/uL (ref 3.4–10.8)

## 2022-05-08 LAB — HEPATITIS C ANTIBODY: Hep C Virus Ab: NONREACTIVE

## 2022-05-08 LAB — TSH+FREE T4
Free T4: 1.13 ng/dL (ref 0.82–1.77)
TSH: 2.45 u[IU]/mL (ref 0.450–4.500)

## 2022-05-08 LAB — HIV ANTIBODY (ROUTINE TESTING W REFLEX): HIV Screen 4th Generation wRfx: NONREACTIVE

## 2022-05-08 LAB — LIPID PANEL
Chol/HDL Ratio: 1.9 ratio (ref 0.0–4.4)
Cholesterol, Total: 159 mg/dL (ref 100–199)
HDL: 83 mg/dL (ref >39)
LDL Chol Calc (NIH): 66 mg/dL (ref 0–99)
Triglycerides: 45 mg/dL (ref 0–149)
VLDL Cholesterol Cal: 10 mg/dL (ref 5–40)

## 2022-05-08 LAB — VITAMIN D 25 HYDROXY (VIT D DEFICIENCY, FRACTURES): Vit D, 25-Hydroxy: 23.1 ng/mL — ABNORMAL LOW (ref 30.0–100.0)

## 2022-05-08 LAB — HEMOGLOBIN A1C
Est. average glucose Bld gHb Est-mCnc: 114 mg/dL
Hgb A1c MFr Bld: 5.6 % (ref 4.8–5.6)

## 2022-05-10 NOTE — Progress Notes (Signed)
Potassium level  is slightly elevated, avoid potassium supplement.   Creatinine level is slightly elevated , avoid ibuprofen , drink at least 64 ounces of water daily.   Vitamin D level is low , start taking vitamin D 1000 units daily   Other labs are normal.

## 2022-05-21 ENCOUNTER — Ambulatory Visit (INDEPENDENT_AMBULATORY_CARE_PROVIDER_SITE_OTHER): Payer: PPO | Admitting: Family Medicine

## 2022-05-21 ENCOUNTER — Encounter: Payer: Self-pay | Admitting: Family Medicine

## 2022-05-21 VITALS — BP 123/67 | HR 95 | Resp 16 | Ht 66.0 in | Wt 128.0 lb

## 2022-05-21 DIAGNOSIS — M25562 Pain in left knee: Secondary | ICD-10-CM | POA: Diagnosis not present

## 2022-05-21 DIAGNOSIS — G8929 Other chronic pain: Secondary | ICD-10-CM

## 2022-05-21 DIAGNOSIS — Z0000 Encounter for general adult medical examination without abnormal findings: Secondary | ICD-10-CM

## 2022-05-21 DIAGNOSIS — Z0001 Encounter for general adult medical examination with abnormal findings: Secondary | ICD-10-CM

## 2022-05-21 MED ORDER — DICLOFENAC SODIUM 1 % EX GEL: 4.0000 g | Freq: Four times a day (QID) | CUTANEOUS | 2 refills | Status: AC

## 2022-05-21 NOTE — Assessment & Plan Note (Signed)
Physical exam as documented Counseling is done on healthy lifestyle involving commitment to 150 minutes of exercise per week,  Discussed heart-healthy diet Immunization and cancer screening needs are specifically addressed at this visit she declines the flu and shingles vaccine today

## 2022-05-21 NOTE — Patient Instructions (Addendum)
I appreciate the opportunity to provide care to you today!    Follow up:  6 months  Please take ibuprofen 600 mg every 8 hours as needed for knee pain or Tylenol 650 mg every 6 hours as needed for knee pain    Please continue to a heart-healthy diet and increase your physical activities. Try to exercise for 44mins at least three times a week.      It was a pleasure to see you and I look forward to continuing to work together on your health and well-being. Please do not hesitate to call the office if you need care or have questions about your care.   Have a wonderful day and week. With Gratitude, Alvira Monday MSN, FNP-BC

## 2022-05-21 NOTE — Progress Notes (Signed)
Complete physical exam  Patient: Misty Kerr   DOB: [birth year]   66 y.o. Female  MRN: [id number]  Subjective:    Chief Complaint  Patient presents with   Annual Exam   Knee Pain    Left knee pain x 4 months (June 20) when she fell. Hurts worse going up and down steps     Misty Kerr is a 66 y.o. female who presents today for a complete physical exam. She reports consuming a general diet. Home exercise routine includes walking 1 hrs per day. She generally feels well. She reports sleeping well. She does have additional problems to discuss today.    Knee pain: She reports that she had a mechanical fall on January 19, 2022.  She reported slipping and falling while climbing up her stairs.  She landed on her hands and knees.  She reports occasional pain in the back of her knees with a noted history of lumbar pain with sciatica.  She denies swelling, redness, warmth, and fever with knee pain.  She denies a decreased range of motion in her left knee but admits to occasional stiffness with prolonged sitting.   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 Most recent fall risk assessment:    05/21/2022    3:03 PM  [place] in the past year? 1  Number falls in past yr: 0  Injury with Fall? 1     Most recent depression screenings:    05/21/2022    3:03 PM 04/09/2022   10:28 AM  PHQ 2/9 Scores  PHQ - 2 Score 0 0    Dental: No current dental problems and Last dental visit: 40months ago  Patient Active Problem List   Diagnosis Date Noted   Knee pain, left 05/21/2022   Encounter for general adult medical examination without abnormal findings 05/21/2022   Pain of finger 04/09/2022   Current every day smoker 12/04/2021   No past medical history on file. Past Surgical History:  Procedure Laterality Date   fusion in neck  2003   and again in 2005   Social History   Tobacco Use   Smoking status: Every Day    Packs/day: 0.50    Types: Cigarettes    Passive  exposure: Never   Smokeless tobacco: Never   Tobacco comments:    Started smoking at age 84, smokes half pack of cigarettes daily. She has quit several times.   Substance Use Topics   Alcohol use: Yes    Alcohol/week: 2.0 standard drinks of alcohol    Types: 2 Shots of liquor per week    Comment: 2 Bottles of tekila weekly .   Drug use: Yes    Types: Marijuana   Social History   Socioeconomic History   Marital status: Divorced    Spouse name: Not on file   Number of children: 1   Years of education: Not on file   Highest education level: Not on file  Occupational History   Not on file  Tobacco Use   Smoking status: Every Day    Packs/day: 0.50    Types: Cigarettes    Passive exposure: Never   Smokeless tobacco: Never   Tobacco comments:    Started smoking at age 66, smokes half pack of cigarettes daily. She has quit several times.   Substance and Sexual Activity   Alcohol use: Yes    Alcohol/week: 2.0 standard drinks of alcohol    Types: 2 Shots  of liquor per week    Comment: 2 Bottles of tekila weekly .   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, Works at a Environmental education officer    Social Determinants of Radio broadcast assistant Strain: Not on file  Food Insecurity: Not on file  Transportation Needs: Not on file  Physical Activity: Not on file  Stress: Not on file  Social Connections: Not on file  Intimate Partner Violence: Not on file   Family Status  Relation Name Status   Mother  Deceased   Father  Deceased   Sister  Alive   Son  Alive   Mat Aunt  Deceased   MGM  Deceased   MGF  Deceased   PGM  (Not Specified)   PGF  Deceased   Neg Hx  (Not Specified)   Family History  Problem Relation Age of Onset   Cancer - Lung Mother    Heart disease Father    Diabetes type II Father    Alzheimer's disease Sister    Hepatitis C Son     Stroke Maternal Grandmother    Stroke Maternal Grandfather    Heart attack Paternal Grandmother    Breast cancer Paternal Grandmother    Liver cancer Paternal Grandfather    Cancer - Colon Neg Hx    Allergies  Allergen Reactions   Penicillins Hives      Patient Care Team: Alvira Monday, FNP as PCP - General (Family Medicine) Patient, No Pcp Per (General Practice)   No outpatient medications prior to visit.   No facility-administered medications prior to visit.    Review of Systems  Constitutional:  Negative for diaphoresis and malaise/fatigue.  HENT:  Negative for sinus pain and sore throat.   Eyes:  Negative for discharge and redness.  Respiratory:  Negative for cough and wheezing.   Cardiovascular:  Negative for palpitations and leg swelling.  Gastrointestinal:  Negative for constipation, nausea and vomiting.  Genitourinary:  Negative for frequency and urgency.  Musculoskeletal:  Positive for joint pain.  Neurological:  Negative for dizziness and headaches.  Endo/Heme/Allergies:  Negative for polydipsia.  Psychiatric/Behavioral:  Negative for memory loss. The patient does not have insomnia.           Objective:     BP 123/67   Pulse 95   Resp 16   Ht $R'5\' 6"'XP$  (1.676 m)   Wt 128 lb (58.1 kg)   SpO2 94%   BMI 20.66 kg/m  BP Readings from Last 3 Encounters:  05/21/22 123/67  04/09/22 138/82  01/20/22 139/82   Wt Readings from Last 3 Encounters:  05/21/22 128 lb (58.1 kg)  04/09/22 124 lb (56.2 kg)  01/20/22 120 lb (54.4 kg)      Physical Exam HENT:     Head: Normocephalic.     Right Ear: External ear normal.     Left Ear: External ear normal.     Nose: No congestion.     Mouth/Throat:     Mouth: Mucous membranes are moist.  Eyes:     Extraocular Movements: Extraocular movements intact.     Pupils: Pupils are equal, round, and reactive to light.  Cardiovascular:     Rate and Rhythm: Normal rate and regular rhythm.     Pulses: Normal pulses.  Heart sounds: Normal heart sounds.  Pulmonary:     Breath sounds: Normal breath sounds.  Abdominal:     Palpations: Abdomen is soft.     Tenderness: There is no right CVA tenderness or left CVA tenderness.  Musculoskeletal:        General: No tenderness.     Cervical back: No rigidity.     Right knee: No swelling, deformity, effusion, erythema or ecchymosis. Normal range of motion.     Left knee: No swelling, deformity, effusion or erythema. Normal range of motion.     Right lower leg: No edema.     Left lower leg: No edema.  Skin:    Findings: No lesion or rash.  Neurological:     Mental Status: She is alert and oriented to person, place, and time.  Psychiatric:     Comments: Normal affect      No results found for any visits on 05/21/22. Last CBC Lab Results  Component Value Date   WBC 7.7 05/07/2022   HGB 14.7 05/07/2022   HCT 43.7 05/07/2022   MCV 86 05/07/2022   MCH 29.1 05/07/2022   RDW 12.6 05/07/2022   PLT 256 03/50/0938   Last metabolic panel Lab Results  Component Value Date   GLUCOSE 84 05/07/2022   NA 142 05/07/2022   K 5.3 (H) 05/07/2022   CL 103 05/07/2022   CO2 26 05/07/2022   BUN 18 05/07/2022   CREATININE 1.04 (H) 05/07/2022   EGFR 60 05/07/2022   CALCIUM 9.5 05/07/2022   PROT 6.2 05/07/2022   ALBUMIN 4.1 05/07/2022   LABGLOB 2.1 05/07/2022   AGRATIO 2.0 05/07/2022   BILITOT 0.5 05/07/2022   ALKPHOS 92 05/07/2022   AST 18 05/07/2022   ALT 15 05/07/2022   Last lipids Lab Results  Component Value Date   CHOL 159 05/07/2022   HDL 83 05/07/2022   LDLCALC 66 05/07/2022   TRIG 45 05/07/2022   CHOLHDL 1.9 05/07/2022   Last hemoglobin A1c Lab Results  Component Value Date   HGBA1C 5.6 05/07/2022   Last thyroid functions Lab Results  Component Value Date   TSH 2.450 05/07/2022   Last vitamin D Lab Results  Component Value Date   VD25OH 23.1 (L) 05/07/2022   Last vitamin B12 and Folate No results found for: "VITAMINB12",  "FOLATE"      Assessment & Plan:    Routine Health Maintenance and Physical Exam  Immunization History  Administered Date(s) Administered   Moderna SARS-COV2 Booster Vaccination 09/18/2019   Moderna Sars-Covid-2 Vaccination 08/21/2019, 09/19/2019   Tdap 01/20/2022    Health Maintenance  Topic Date Due   Pneumonia Vaccine 58+ Years old (1 - PCV) Never done   Zoster Vaccines- Shingrix (1 of 2) Never done   PAP SMEAR-Modifier  Never done   COVID-19 Vaccine (3 - Moderna risk series) 10/17/2019   DEXA SCAN  Never done   INFLUENZA VACCINE  Never done   MAMMOGRAM  02/27/2024   Fecal DNA (Cologuard)  12/25/2024   TETANUS/TDAP  01/21/2032   Hepatitis C Screening  Completed   HIV Screening  Completed   HPV VACCINES  Aged Out    Discussed health benefits of physical activity, and encouraged her to engage in regular exercise appropriate for her age and condition.  Problem List Items Addressed This Visit       Other   Knee pain, left    Low suspicion for fractures or dislocation of the left knee Imaging studies not  warranted at the moment Encouraged to take ibuprofen 600 mg every 8 hours as needed for pain Topical Voltaren gel ordered No signs of inflammation noted      Relevant Medications   diclofenac Sodium (VOLTAREN) 1 % GEL   Encounter for general adult medical examination without abnormal findings - Primary    Physical exam as documented Counseling is done on healthy lifestyle involving commitment to 150 minutes of exercise per week,  Discussed heart-healthy diet Immunization and cancer screening needs are specifically addressed at this visit she declines the flu and shingles vaccine today           Return in about 6 months (around 11/20/2022).     Alvira Monday, FNP

## 2022-05-21 NOTE — Assessment & Plan Note (Addendum)
Low suspicion for fractures or dislocation of the left knee Imaging studies not warranted at the moment Encouraged to take ibuprofen 600 mg every 8 hours as needed for pain Topical Voltaren gel ordered No signs of inflammation noted

## 2022-05-28 NOTE — Addendum Note (Signed)
Addended byAlvira Monday on: 05/28/2022 05:28 PM   Modules accepted: Level of Service

## 2022-07-14 ENCOUNTER — Other Ambulatory Visit: Payer: Self-pay

## 2022-11-26 ENCOUNTER — Ambulatory Visit (INDEPENDENT_AMBULATORY_CARE_PROVIDER_SITE_OTHER): Payer: PPO | Admitting: Family Medicine

## 2022-11-26 VITALS — BP 129/77 | HR 83 | Ht 66.0 in | Wt 128.1 lb

## 2022-11-26 DIAGNOSIS — Z720 Tobacco use: Secondary | ICD-10-CM

## 2022-11-26 DIAGNOSIS — E038 Other specified hypothyroidism: Secondary | ICD-10-CM

## 2022-11-26 DIAGNOSIS — E7849 Other hyperlipidemia: Secondary | ICD-10-CM

## 2022-11-26 DIAGNOSIS — Z122 Encounter for screening for malignant neoplasm of respiratory organs: Secondary | ICD-10-CM

## 2022-11-26 DIAGNOSIS — R7301 Impaired fasting glucose: Secondary | ICD-10-CM | POA: Diagnosis not present

## 2022-11-26 DIAGNOSIS — E559 Vitamin D deficiency, unspecified: Secondary | ICD-10-CM | POA: Insufficient documentation

## 2022-11-26 MED ORDER — VITAMIN D (ERGOCALCIFEROL) 1.25 MG (50000 UNIT) PO CAPS: 50000.0000 [IU] | ORAL_CAPSULE | ORAL | 1 refills | Status: AC

## 2022-11-26 NOTE — Assessment & Plan Note (Signed)
She reports smoking since the age of 55 She was smoking 1 pack a day but has cut back to half a pack daily Smoking cessation encouraged Patient is asymptomatic Referral placed to pulmonary for lung cancer screening

## 2022-11-26 NOTE — Patient Instructions (Signed)
I appreciate the opportunity to provide care to you today!    Follow up:  7 months  Labs: please stop by the lab during the week  to get your blood drawn (CBC, CMP, TSH, Lipid profile, HgA1c, Vit D)  Please pick up your prescription at the pharmacy  Smoking is harmful to your health and increases your risk for cancer, COPD, high blood pressure, cataracts, digestive problems, or health problems , such as gum disease, mouth sores, and tooth loss and loss of taste and smell. Smoking irritates your throat and causes coughing.   Referrals today-pulmonary for lung cancer screening   Please continue to a heart-healthy diet and increase your physical activities. Try to exercise for at least five days a week.      It was a pleasure to see you and I look forward to continuing to work together on your health and well-being. Please do not hesitate to call the office if you need care or have questions about your care.   Have a wonderful day and week. With Gratitude, Gilmore Laroche MSN, FNP-BC

## 2022-11-26 NOTE — Assessment & Plan Note (Signed)
Reports not taking her weekly vitamin D supplement Will assess her vitamin D levels today before reinstating therapy

## 2022-11-26 NOTE — Progress Notes (Signed)
Established Patient Office Visit  Subjective:  Patient ID: Misty Kerr, female    DOB: [birth year]  Age: 67 y.o. MRN: [id number]  CC:  Chief Complaint  Patient presents with   Chronic Care Management    6 month f/u, pt has no complaints.     HPI Misty Kerr is a 67 y.o. female presents for f/u. For the details of today's visit, please refer to the assessment and plan.     History reviewed. No pertinent past medical history.  Past Surgical History:  Procedure Laterality Date   fusion in neck  2003   and again in 2005    Family History  Problem Relation Age of Onset   Cancer - Lung Mother    Heart disease Father    Diabetes type II Father    Alzheimer's disease Sister    Hepatitis C Son    Stroke Maternal Grandmother    Stroke Maternal Grandfather    Heart attack Paternal Grandmother    Breast cancer Paternal Grandmother    Liver cancer Paternal Grandfather    Cancer - Colon Neg Hx     Social History   Socioeconomic History   Marital status: Divorced    Spouse name: Not on file   Number of children: 1   Years of education: Not on file   Highest education level: 12th grade  Occupational History   Not on file  Tobacco Use   Smoking status: Every Day    Packs/day: .5    Types: Cigarettes    Passive exposure: Never   Smokeless tobacco: Never   Tobacco comments:    Started smoking at age 63, smokes half pack of cigarettes daily. She has quit several times.   Substance and Sexual Activity   Alcohol use: Yes    Alcohol/week: 2.0 standard drinks of alcohol    Types: 2 Shots of liquor per week    Comment: 2 Bottles of tekila weekly .   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, Works at a Training and development officer    Social Determinants of Health   Financial Resource Strain: Low Risk  (11/26/2022)   Overall Financial Resource Strain  (CARDIA)    Difficulty of Paying Living Expenses: Not very hard  Food Insecurity: No Food Insecurity (11/26/2022)   Hunger Vital Sign    Worried About Running Out of Food in the Last Year: Never true    Ran Out of Food in the Last Year: Never true  Transportation Needs: No Transportation Needs (11/26/2022)   PRAPARE - Administrator, Civil Service (Medical): No    Lack of Transportation (Non-Medical): No  Physical Activity: Sufficiently Active (11/26/2022)   Exercise Vital Sign    Days of Exercise per Week: 6 days    Minutes of Exercise per Session: 30 min  Stress: No Stress Concern Present (11/26/2022)   [employer] of Occupational Health - Occupational Stress Questionnaire    Feeling of Stress : Not at all  Social Connections: Socially Isolated (11/26/2022)   Social Connection and Isolation Panel [NHANES]    Frequency of Communication with Friends and Family: Twice a week    Frequency of Social Gatherings with Friends and Family: Once a week    Attends Religious Services: Never    Production manager of [employer]  or Organizations: No    Attends Banker Meetings: Not on file    Marital Status: Divorced  Intimate Partner Violence: Not on file    Outpatient Medications Prior to Visit  Medication Sig Dispense Refill   diclofenac Sodium (VOLTAREN) 1 % GEL Apply 4 g topically 4 (four) times daily. 2 g 2   No facility-administered medications prior to visit.    Allergies  Allergen Reactions   Penicillins Hives    ROS Review of Systems  Constitutional:  Negative for chills and fever.  Eyes:  Negative for visual disturbance.  Respiratory:  Negative for chest tightness and shortness of breath.   Neurological:  Negative for dizziness and headaches.      Objective:    Physical Exam HENT:     Head: Normocephalic.     Mouth/Throat:     Mouth: Mucous membranes are moist.  Cardiovascular:     Rate and Rhythm: Normal rate.     Heart sounds: Normal heart  sounds.  Pulmonary:     Effort: Pulmonary effort is normal.     Breath sounds: Normal breath sounds.  Neurological:     Mental Status: She is alert.     BP 129/77   Pulse 83   Ht 5\' 6"  (1.676 m)   Wt 128 lb 1.9 oz (58.1 kg)   SpO2 97%   BMI 20.68 kg/m  Wt Readings from Last 3 Encounters:  11/26/22 128 lb 1.9 oz (58.1 kg)  05/21/22 128 lb (58.1 kg)  04/09/22 124 lb (56.2 kg)    Lab Results  Component Value Date   TSH 2.450 05/07/2022   Lab Results  Component Value Date   WBC 7.7 05/07/2022   HGB 14.7 05/07/2022   HCT 43.7 05/07/2022   MCV 86 05/07/2022   PLT 256 05/07/2022   Lab Results  Component Value Date   NA 142 05/07/2022   K 5.3 (H) 05/07/2022   CO2 26 05/07/2022   GLUCOSE 84 05/07/2022   BUN 18 05/07/2022   CREATININE 1.04 (H) 05/07/2022   BILITOT 0.5 05/07/2022   ALKPHOS 92 05/07/2022   AST 18 05/07/2022   ALT 15 05/07/2022   PROT 6.2 05/07/2022   ALBUMIN 4.1 05/07/2022   CALCIUM 9.5 05/07/2022   EGFR 60 05/07/2022   Lab Results  Component Value Date   CHOL 159 05/07/2022   Lab Results  Component Value Date   HDL 83 05/07/2022   Lab Results  Component Value Date   LDLCALC 66 05/07/2022   Lab Results  Component Value Date   TRIG 45 05/07/2022   Lab Results  Component Value Date   CHOLHDL 1.9 05/07/2022   Lab Results  Component Value Date   HGBA1C 5.6 05/07/2022      Assessment & Plan:  Tobacco use Assessment & Plan: She reports smoking since the age of 62 She was smoking 1 pack a day but has cut back to half a pack daily Smoking cessation encouraged Patient is asymptomatic Referral placed to pulmonary for lung cancer screening    Screening for lung cancer -     Ambulatory Referral for Lung Cancer Scre  Vitamin D deficiency Assessment & Plan: Reports not taking her weekly vitamin D supplement Will assess her vitamin D levels today before reinstating therapy  Orders: -     VITAMIN D 25 Hydroxy (Vit-D Deficiency,  Fractures) -     Vitamin D (Ergocalciferol); Take 1 capsule (50,000 Units total) by mouth every 7 (seven)  days.  Dispense: 20 capsule; Refill: 1  IFG (impaired fasting glucose) -     Hemoglobin A1c  Other specified hypothyroidism -     TSH + free T4  Other hyperlipidemia -     Lipid panel -     CMP14+EGFR -     CBC with Differential/Platelet    Follow-up: Return in about 7 months (around 06/28/2023).   Gilmore Laroche, FNP

## 2023-03-29 ENCOUNTER — Encounter: Payer: Self-pay | Admitting: *Deleted

## 2023-05-31 ENCOUNTER — Ambulatory Visit: Payer: PPO | Admitting: Dermatology

## 2023-05-31 ENCOUNTER — Encounter: Payer: Self-pay | Admitting: Dermatology

## 2023-05-31 DIAGNOSIS — Z1283 Encounter for screening for malignant neoplasm of skin: Secondary | ICD-10-CM

## 2023-05-31 DIAGNOSIS — L578 Other skin changes due to chronic exposure to nonionizing radiation: Secondary | ICD-10-CM

## 2023-05-31 DIAGNOSIS — D229 Melanocytic nevi, unspecified: Secondary | ICD-10-CM

## 2023-05-31 DIAGNOSIS — W908XXA Exposure to other nonionizing radiation, initial encounter: Secondary | ICD-10-CM | POA: Diagnosis not present

## 2023-05-31 DIAGNOSIS — L821 Other seborrheic keratosis: Secondary | ICD-10-CM | POA: Diagnosis not present

## 2023-05-31 DIAGNOSIS — D239 Other benign neoplasm of skin, unspecified: Secondary | ICD-10-CM

## 2023-05-31 DIAGNOSIS — D1801 Hemangioma of skin and subcutaneous tissue: Secondary | ICD-10-CM

## 2023-05-31 DIAGNOSIS — D2371 Other benign neoplasm of skin of right lower limb, including hip: Secondary | ICD-10-CM

## 2023-05-31 DIAGNOSIS — Z872 Personal history of diseases of the skin and subcutaneous tissue: Secondary | ICD-10-CM | POA: Diagnosis not present

## 2023-05-31 DIAGNOSIS — L820 Inflamed seborrheic keratosis: Secondary | ICD-10-CM

## 2023-05-31 DIAGNOSIS — L814 Other melanin hyperpigmentation: Secondary | ICD-10-CM

## 2023-05-31 NOTE — Progress Notes (Addendum)
New Patient Visit   Subjective  Misty Kerr is a 67 y.o. female who presents for the following: Skin Cancer Screening and Full Body Skin Exam  The patient presents for Total-Body Skin Exam (TBSE) for skin cancer screening and mole check. The patient has spots, moles and lesions to be evaluated, some may be new or changing and the patient may have concern these could be cancer.  Patient complains of mole behind left ear. Itches sometimes. Denies bleeding. No family hx of skin cancer. No personal hx of skin cancer or DN   The following portions of the chart were reviewed this encounter and updated as appropriate: medications, allergies, medical history  Review of Systems:  No other skin or systemic complaints except as noted in HPI or Assessment and Plan.  Objective  Well appearing patient in no apparent distress; mood and affect are within normal limits.  A full examination was performed including scalp, head, eyes, ears, nose, lips, neck, chest, axillae, abdomen, back, buttocks, bilateral upper extremities, bilateral lower extremities, hands, feet, fingers, toes, fingernails, and toenails. All findings within normal limits unless otherwise noted below.   Relevant physical exam findings are noted in the Assessment and Plan.  Left Postauricular Sulcus Stuck-on, waxy, tan-brown papule or plaque --Discussed benign etiology and prognosis.   Exam of nails limited by presence of nail polish.   Assessment & Plan   SKIN CANCER SCREENING PERFORMED TODAY.  ACTINIC DAMAGE - Chronic condition, secondary to cumulative UV/sun exposure - diffuse scaly erythematous macules with underlying dyspigmentation - Recommend daily broad spectrum sunscreen SPF 30+ to sun-exposed areas, reapply every 2 hours as needed.  - Staying in the shade or wearing long sleeves, sun glasses (UVA+UVB protection) and wide brim hats (4-inch brim around the entire circumference of the hat) are also recommended for sun  protection.  - Call for new or changing lesions.  LENTIGINES, SEBORRHEIC KERATOSES, HEMANGIOMAS - Benign normal skin lesions - Benign-appearing - Call for any changes  MELANOCYTIC NEVI - Tan-brown and/or pink-flesh-colored symmetric macules and papules - Benign appearing on exam today - Observation - Call clinic for new or changing moles - Recommend daily use of broad spectrum spf 30+ sunscreen to sun-exposed areas.   History of ACTINIC KERATOSIS  Distal dorsum nose  Previously treated with LN2  Treatment Plan: Resolved, observe  Recommend daily broad spectrum sunscreen SPF 30+ to sun-exposed areas, reapply every 2 hours as needed. Call for new or changing lesions   DERMATOFIBROMA Exam: Firm pink/brown papulenodule with dimple sign. R lower calf  Treatment Plan: A dermatofibroma is a benign growth possibly related to trauma, such as an insect bite, cut from shaving, or inflamed acne-type bump.  Treatment options to remove include shave or excision with resulting scar and risk of recurrence.  Since benign-appearing and not bothersome, will observe for now.    Inflamed seborrheic keratosis Left Postauricular Sulcus  Destruction of lesion - Left Postauricular Sulcus  Destruction method: cryotherapy   Informed consent: discussed and consent obtained   Lesion destroyed using liquid nitrogen: Yes   Outcome: patient tolerated procedure well with no complications   Post-procedure details: wound care instructions given    Dermatofibroma  Lentigines  Multiple benign nevi  Seborrheic keratoses  Actinic elastosis  Cherry angioma    Return in about 1 year (around 05/30/2024) for tbse.  I, Germaine Pomfret, CMA, am acting as scribe for Elie Goody, MD.   Documentation: I have reviewed the above documentation for accuracy and completeness,  and I agree with the above.  Elie Goody, MD

## 2023-07-01 ENCOUNTER — Ambulatory Visit: Payer: PPO | Admitting: Family Medicine

## 2024-02-24 ENCOUNTER — Encounter: Admitting: Family Medicine

## 2024-05-31 ENCOUNTER — Ambulatory Visit: Payer: PPO | Admitting: Dermatology

## 2024-06-04 ENCOUNTER — Ambulatory Visit: Admitting: Dermatology

## 2024-06-25 ENCOUNTER — Ambulatory Visit: Admitting: Dermatology

## 2024-06-25 ENCOUNTER — Encounter: Payer: Self-pay | Admitting: Dermatology

## 2024-06-25 DIAGNOSIS — D239 Other benign neoplasm of skin, unspecified: Secondary | ICD-10-CM

## 2024-06-25 DIAGNOSIS — D229 Melanocytic nevi, unspecified: Secondary | ICD-10-CM

## 2024-06-25 DIAGNOSIS — L821 Other seborrheic keratosis: Secondary | ICD-10-CM

## 2024-06-25 DIAGNOSIS — Z872 Personal history of diseases of the skin and subcutaneous tissue: Secondary | ICD-10-CM

## 2024-06-25 DIAGNOSIS — L814 Other melanin hyperpigmentation: Secondary | ICD-10-CM

## 2024-06-25 DIAGNOSIS — Z1283 Encounter for screening for malignant neoplasm of skin: Secondary | ICD-10-CM

## 2024-06-25 DIAGNOSIS — D692 Other nonthrombocytopenic purpura: Secondary | ICD-10-CM

## 2024-06-25 DIAGNOSIS — L578 Other skin changes due to chronic exposure to nonionizing radiation: Secondary | ICD-10-CM | POA: Diagnosis not present

## 2024-06-25 DIAGNOSIS — W908XXA Exposure to other nonionizing radiation, initial encounter: Secondary | ICD-10-CM | POA: Diagnosis not present

## 2024-06-25 DIAGNOSIS — D2371 Other benign neoplasm of skin of right lower limb, including hip: Secondary | ICD-10-CM

## 2024-06-25 DIAGNOSIS — B351 Tinea unguium: Secondary | ICD-10-CM

## 2024-06-25 DIAGNOSIS — D1801 Hemangioma of skin and subcutaneous tissue: Secondary | ICD-10-CM

## 2024-06-25 DIAGNOSIS — L820 Inflamed seborrheic keratosis: Secondary | ICD-10-CM

## 2024-06-25 NOTE — Patient Instructions (Signed)

## 2024-06-25 NOTE — Progress Notes (Signed)
 Follow-Up Visit   Subjective  Misty Kerr is a 68 y.o. female who presents for the following: Skin Cancer Screening and Full Body Skin Exam. No personal Hx of skin cancer. Hx of AK.  Lesion of concern on back.   The patient presents for Total-Body Skin Exam (TBSE) for skin cancer screening and mole check. The patient has spots, moles and lesions to be evaluated, some may be new or changing and the patient may have concern these could be cancer.    The following portions of the chart were reviewed this encounter and updated as appropriate: medications, allergies, medical history  Review of Systems:  No other skin or systemic complaints except as noted in HPI or Assessment and Plan.  Objective  Well appearing patient in no apparent distress; mood and affect are within normal limits.  A full examination was performed including scalp, head, eyes, ears, nose, lips, neck, chest, axillae, abdomen, back, buttocks, bilateral upper extremities, bilateral lower extremities, hands, feet, fingers, toes, fingernails, and toenails. All findings within normal limits unless otherwise noted below.   Relevant physical exam findings are noted in the Assessment and Plan.    Assessment & Plan   SKIN CANCER SCREENING PERFORMED TODAY.  History of ACTINIC KERATOSIS   Distal dorsum nose   Previously treated with LN2   Treatment Plan: Resolved, observe   Recommend daily broad spectrum sunscreen SPF 30+ to sun-exposed areas, reapply every 2 hours as needed. Call for new or changing lesions    DERMATOFIBROMA Exam: Firm pink/brown papulenodule with dimple sign. R lower calf   Treatment Plan: A dermatofibroma is a benign growth possibly related to trauma, such as an insect bite, cut from shaving, or inflamed acne-type bump.  Treatment options to remove include shave or excision with resulting scar and risk of recurrence.  Since benign-appearing and not bothersome, will observe for now.     ACTINIC DAMAGE - Chronic condition, secondary to cumulative UV/sun exposure - diffuse scaly erythematous macules with underlying dyspigmentation - Recommend daily broad spectrum sunscreen SPF 30+ to sun-exposed areas, reapply every 2 hours as needed.  - Staying in the shade or wearing long sleeves, sun glasses (UVA+UVB protection) and wide brim hats (4-inch brim around the entire circumference of the hat) are also recommended for sun protection.  - Call for new or changing lesions.  LENTIGINES, SEBORRHEIC KERATOSES, HEMANGIOMAS - Benign normal skin lesions - Benign-appearing - Call for any changes  MELANOCYTIC NEVI - Tan-brown and/or pink-flesh-colored symmetric macules and papules - Benign appearing on exam today - Observation - Call clinic for new or changing moles - Recommend daily use of broad spectrum spf 30+ sunscreen to sun-exposed areas.    INFLAMED SEBORRHEIC KERATOSIS Exam: Erythematous keratotic or waxy stuck-on papule on R back at bra line.  Benign-appearing.  Call clinic for new or changing lesions.   Prior to procedure, discussed risks of blister formation, small wound, skin dyspigmentation, or rare scar following treatment. Recommend Vaseline ointment to treated areas while healing.  Treatment:  Patient deferred treatment at this time.   Purpura - Chronic; persistent and recurrent.  Treatable, but not curable. - Violaceous macules and patches at arms - Benign - Related to trauma, age, sun damage and/or use of blood thinners, chronic use of topical and/or oral steroids - Observe - Can use OTC arnica containing moisturizer such as Dermend Bruise Formula if desired - Call for worsening or other concerns  ONYCHOMYCOSIS Exam: Thickened toenails with subungal debris c/w onychomycosis  Chronic and persistent condition with duration or expected duration over one year. Condition is symptomatic/ bothersome to patient. Not currently at goal.  Treatment  Plan: Patient deferred treatment at this time.    MULTIPLE BENIGN NEVI   LENTIGINES   SEBORRHEIC KERATOSES   CHERRY ANGIOMA   ACTINIC ELASTOSIS   DERMATOFIBROMA   SOLAR PURPURA   ONYCHOMYCOSIS   Return in about 1 year (around 06/25/2025) for TBSE.  I, Kate Fought, CMA, am acting as scribe for Boneta Sharps, MD.   Documentation: I have reviewed the above documentation for accuracy and completeness, and I agree with the above.  Boneta Sharps, MD

## 2025-06-25 ENCOUNTER — Ambulatory Visit: Admitting: Dermatology
